# Patient Record
Sex: Male | Born: 1985 | Race: White | Hispanic: No | Marital: Married | Smoking: Current every day smoker
Health system: Southern US, Community
[De-identification: ages and names within clinical notes are randomized; demographics above are authoritative.]

---

## 2016-06-07 ENCOUNTER — Emergency Department
Admission: EM | Admit: 2016-06-07 | Discharge: 2016-06-07 | Disposition: A | Discharge status: Home / Self Care | Payer: PRIVATE HEALTH INSURANCE | Attending: Emergency Medicine | Admitting: Emergency Medicine

## 2016-06-07 ENCOUNTER — Emergency Department: Payer: PRIVATE HEALTH INSURANCE

## 2016-06-07 ENCOUNTER — Encounter: Payer: Self-pay | Admitting: Emergency Medicine

## 2016-06-07 DIAGNOSIS — R51 Headache: Secondary | ICD-10-CM | POA: Diagnosis not present

## 2016-06-07 DIAGNOSIS — S0990XA Unspecified injury of head, initial encounter: Secondary | ICD-10-CM

## 2016-06-07 DIAGNOSIS — S62309A Unspecified fracture of unspecified metacarpal bone, initial encounter for closed fracture: Secondary | ICD-10-CM

## 2016-06-07 DIAGNOSIS — S62617A Displaced fracture of proximal phalanx of left little finger, initial encounter for closed fracture: Secondary | ICD-10-CM | POA: Diagnosis not present

## 2016-06-07 DIAGNOSIS — S6992XA Unspecified injury of left wrist, hand and finger(s), initial encounter: Secondary | ICD-10-CM | POA: Diagnosis present

## 2016-06-07 DIAGNOSIS — Y999 Unspecified external cause status: Secondary | ICD-10-CM | POA: Diagnosis not present

## 2016-06-07 DIAGNOSIS — Y9389 Activity, other specified: Secondary | ICD-10-CM | POA: Insufficient documentation

## 2016-06-07 DIAGNOSIS — Y92410 Unspecified street and highway as the place of occurrence of the external cause: Secondary | ICD-10-CM | POA: Diagnosis not present

## 2016-06-07 DIAGNOSIS — F17200 Nicotine dependence, unspecified, uncomplicated: Secondary | ICD-10-CM | POA: Diagnosis not present

## 2016-06-07 DIAGNOSIS — S0093XA Contusion of unspecified part of head, initial encounter: Secondary | ICD-10-CM

## 2016-06-07 MED ORDER — OXYCODONE-ACETAMINOPHEN 5-325 MG PO TABS: 1.0000 | ORAL_TABLET | ORAL | 0 refills | Status: AC | PRN
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
